# Patient Record
Sex: Male | Born: 2000 | Race: White | Hispanic: No | Marital: Single | Smoking: Never smoker
Health system: Southern US, Community
[De-identification: ages and names within clinical notes are randomized; demographics above are authoritative.]

---
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
## 2020-01-05 ENCOUNTER — Encounter: Payer: Self-pay | Admitting: Dermatology

## 2020-01-08 DIAGNOSIS — Z03818 Encounter for observation for suspected exposure to other biological agents ruled out: Secondary | ICD-10-CM | POA: Diagnosis not present

## 2020-01-08 DIAGNOSIS — Z20822 Contact with and (suspected) exposure to covid-19: Secondary | ICD-10-CM | POA: Diagnosis not present

## 2020-01-20 DIAGNOSIS — Z713 Dietary counseling and surveillance: Secondary | ICD-10-CM | POA: Diagnosis not present

## 2020-01-20 DIAGNOSIS — Z0000 Encounter for general adult medical examination without abnormal findings: Secondary | ICD-10-CM | POA: Diagnosis not present

## 2020-01-20 DIAGNOSIS — Z23 Encounter for immunization: Secondary | ICD-10-CM | POA: Diagnosis not present

## 2020-01-20 DIAGNOSIS — R69 Illness, unspecified: Secondary | ICD-10-CM | POA: Diagnosis not present

## 2020-01-20 DIAGNOSIS — Z68.41 Body mass index (BMI) pediatric, 5th percentile to less than 85th percentile for age: Secondary | ICD-10-CM | POA: Diagnosis not present

## 2020-01-20 DIAGNOSIS — Z113 Encounter for screening for infections with a predominantly sexual mode of transmission: Secondary | ICD-10-CM | POA: Diagnosis not present

## 2020-02-11 DIAGNOSIS — Z23 Encounter for immunization: Secondary | ICD-10-CM | POA: Diagnosis not present

## 2020-03-10 ENCOUNTER — Ambulatory Visit: Payer: 59 | Admitting: Dermatology

## 2020-12-23 ENCOUNTER — Other Ambulatory Visit
Admission: RE | Admit: 2020-12-23 | Discharge: 2020-12-23 | Disposition: A | Discharge status: Home / Self Care | Payer: 59 | Source: Ambulatory Visit | Attending: Student | Admitting: Student

## 2020-12-23 DIAGNOSIS — R002 Palpitations: Secondary | ICD-10-CM | POA: Diagnosis not present

## 2020-12-23 DIAGNOSIS — R69 Illness, unspecified: Secondary | ICD-10-CM | POA: Diagnosis not present

## 2020-12-23 DIAGNOSIS — K219 Gastro-esophageal reflux disease without esophagitis: Secondary | ICD-10-CM | POA: Diagnosis not present

## 2020-12-23 DIAGNOSIS — R079 Chest pain, unspecified: Secondary | ICD-10-CM | POA: Diagnosis not present

## 2020-12-23 LAB — D-DIMER, QUANTITATIVE: D-Dimer, Quant: <0.27 ug/mL-FEU (ref 0.00–0.50)

## 2020-12-23 LAB — TROPONIN I (HIGH SENSITIVITY): Troponin I (High Sensitivity): <2 ng/L (ref <18)

## 2021-01-03 DIAGNOSIS — R079 Chest pain, unspecified: Secondary | ICD-10-CM | POA: Diagnosis not present

## 2021-01-03 DIAGNOSIS — R002 Palpitations: Secondary | ICD-10-CM | POA: Diagnosis not present

## 2021-01-31 DIAGNOSIS — K219 Gastro-esophageal reflux disease without esophagitis: Secondary | ICD-10-CM | POA: Diagnosis not present

## 2021-02-16 DIAGNOSIS — R079 Chest pain, unspecified: Secondary | ICD-10-CM | POA: Diagnosis not present

## 2021-02-16 DIAGNOSIS — R002 Palpitations: Secondary | ICD-10-CM | POA: Diagnosis not present

## 2021-04-03 DIAGNOSIS — R0789 Other chest pain: Secondary | ICD-10-CM | POA: Diagnosis not present

## 2021-04-03 DIAGNOSIS — K219 Gastro-esophageal reflux disease without esophagitis: Secondary | ICD-10-CM | POA: Diagnosis not present

## 2021-04-14 DIAGNOSIS — J111 Influenza due to unidentified influenza virus with other respiratory manifestations: Secondary | ICD-10-CM | POA: Diagnosis not present

## 2021-08-22 DIAGNOSIS — J069 Acute upper respiratory infection, unspecified: Secondary | ICD-10-CM | POA: Diagnosis not present

## 2021-12-13 DIAGNOSIS — N481 Balanitis: Secondary | ICD-10-CM | POA: Diagnosis not present

## 2022-09-03 DIAGNOSIS — R1010 Upper abdominal pain, unspecified: Secondary | ICD-10-CM | POA: Diagnosis not present
